# Patient Record
Sex: Female | Born: 1955 | Race: White | Hispanic: No | Marital: Married
Health system: Southern US, Community
[De-identification: ages and names within clinical notes are randomized; demographics above are authoritative.]

---

## 2002-07-28 ENCOUNTER — Encounter: Payer: Self-pay | Admitting: Internal Medicine

## 2002-07-28 ENCOUNTER — Encounter: Admission: RE | Admit: 2002-07-28 | Discharge: 2002-07-28 | Payer: Self-pay | Admitting: Internal Medicine

## 2002-08-07 ENCOUNTER — Encounter: Admission: RE | Admit: 2002-08-07 | Discharge: 2002-08-07 | Payer: Self-pay | Admitting: Internal Medicine

## 2002-08-07 ENCOUNTER — Encounter: Payer: Self-pay | Admitting: Internal Medicine

## 2002-08-21 ENCOUNTER — Encounter: Admission: RE | Admit: 2002-08-21 | Discharge: 2002-08-21 | Payer: Self-pay | Admitting: Internal Medicine

## 2002-09-18 ENCOUNTER — Encounter: Admission: RE | Admit: 2002-09-18 | Discharge: 2002-09-18 | Payer: Self-pay | Admitting: Internal Medicine

## 2002-09-18 ENCOUNTER — Encounter: Payer: Self-pay | Admitting: Internal Medicine

## 2002-09-30 ENCOUNTER — Encounter: Admission: RE | Admit: 2002-09-30 | Discharge: 2002-09-30 | Payer: Self-pay | Admitting: Internal Medicine

## 2002-09-30 ENCOUNTER — Encounter: Payer: Self-pay | Admitting: Internal Medicine

## 2002-10-20 ENCOUNTER — Encounter: Admission: RE | Admit: 2002-10-20 | Discharge: 2002-10-20 | Payer: Self-pay | Admitting: Internal Medicine

## 2002-10-20 ENCOUNTER — Encounter: Payer: Self-pay | Admitting: Internal Medicine

## 2002-11-03 ENCOUNTER — Encounter: Admission: RE | Admit: 2002-11-03 | Discharge: 2002-11-03 | Payer: Self-pay | Admitting: Internal Medicine

## 2002-11-03 ENCOUNTER — Encounter: Payer: Self-pay | Admitting: Internal Medicine

## 2002-11-18 ENCOUNTER — Encounter: Admission: RE | Admit: 2002-11-18 | Discharge: 2002-11-18 | Payer: Self-pay | Admitting: Internal Medicine

## 2002-11-18 ENCOUNTER — Encounter: Payer: Self-pay | Admitting: Internal Medicine

## 2002-12-21 ENCOUNTER — Encounter: Admission: RE | Admit: 2002-12-21 | Discharge: 2002-12-21 | Payer: Self-pay | Admitting: Internal Medicine

## 2002-12-21 ENCOUNTER — Encounter: Payer: Self-pay | Admitting: Internal Medicine

## 2002-12-25 ENCOUNTER — Encounter (INDEPENDENT_AMBULATORY_CARE_PROVIDER_SITE_OTHER): Payer: Self-pay | Admitting: *Deleted

## 2002-12-25 ENCOUNTER — Ambulatory Visit (HOSPITAL_BASED_OUTPATIENT_CLINIC_OR_DEPARTMENT_OTHER): Admission: RE | Admit: 2002-12-25 | Discharge: 2002-12-25 | Payer: Self-pay | Admitting: General Surgery

## 2004-01-13 ENCOUNTER — Encounter: Admission: RE | Admit: 2004-01-13 | Discharge: 2004-01-13 | Payer: Self-pay | Admitting: Infectious Diseases

## 2007-09-30 ENCOUNTER — Encounter: Admission: RE | Admit: 2007-09-30 | Discharge: 2007-09-30 | Payer: Self-pay | Admitting: Family Medicine

## 2007-10-07 ENCOUNTER — Encounter: Admission: RE | Admit: 2007-10-07 | Discharge: 2007-10-07 | Payer: Self-pay | Admitting: Family Medicine

## 2007-10-17 ENCOUNTER — Encounter: Admission: RE | Admit: 2007-10-17 | Discharge: 2007-10-17 | Payer: Self-pay | Admitting: Family Medicine

## 2007-10-17 ENCOUNTER — Encounter (INDEPENDENT_AMBULATORY_CARE_PROVIDER_SITE_OTHER): Payer: Self-pay | Admitting: Diagnostic Radiology

## 2008-04-06 ENCOUNTER — Encounter: Admission: RE | Admit: 2008-04-06 | Discharge: 2008-04-06 | Payer: Self-pay | Admitting: Family Medicine

## 2008-06-02 ENCOUNTER — Emergency Department (HOSPITAL_COMMUNITY): Admission: EM | Admit: 2008-06-02 | Discharge: 2008-06-02 | Payer: Self-pay | Admitting: Emergency Medicine

## 2010-11-18 ENCOUNTER — Encounter: Payer: Self-pay | Admitting: General Surgery

## 2011-03-16 NOTE — Op Note (Signed)
NAME:  TENLEIGH, BYER                         ACCOUNT NO.:  [id number]   MEDICAL RECORD NO.:  [id number]                   PATIENT TYPE:  AMB   LOCATION:  DSC                                  FACILITY:  [facility]   PHYSICIAN:  Anselm Pancoast. Zachery Dakins, M.D.          DATE OF BIRTH:  May 05, 1956   DATE OF PROCEDURE:  12/25/2002  DATE OF DISCHARGE:                                 OPERATIVE REPORT   PREOPERATIVE DIAGNOSIS:  Recurrent subareolar abscess left.   POSTOPERATIVE DIAGNOSIS:  Recurrent subareolar abscess left.   OPERATION PERFORMED:   SURGEON:  Anselm Pancoast. Zachery Dakins, M.D.   ASSISTANT:   ANESTHESIA:   COMPLICATIONS:   INDICATIONS FOR PROCEDURE:  The patient is a 55 year old female who was  referred to [place] actually by Dr. ________ [facility] where the  patient has been followed over the last several months, originally seen by  Sharlet Salina, M.D. for a left breast abscess.  This drained  spontaneously.  She had been on antibiotics continuing to be getting better  and then approximately a week ago she started having similar recurring  symptoms.  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 This was thought to be benign but I saw her in the office two days ago  and she said that really she wanted to wait until the weekend if possible  because of work on having any surgery done and she is scheduled for this  time approximately 48 hours later.  She has been on Keflex over the last 48  hours.  The area has drained and a little second area very close and the  actual erythema is actually less than when I saw her.  She had a hemoglobin  this morning and is in no chronic health problems.   DESCRIPTION OF PROCEDURE:  The patient was taken to the operative suite.  She had been given a gram of Kefzol.  She was positioned on the operating  table with induction of general  anesthesia.  The left breast, first I used a  hemostat to kind of open the area and good tablespoon of frank purulence  came forth.  This was cultured anaerobically and then the breast was prepped  with Betadine surgical scrub and solution and draped in a sterile manner.  The little areas where it has drained, its course not right at the true  areolar edge but close and I made a little curved incision including the two  little areas of spontaneous drain at the lateral edge of the areola.  This  was approximately the 10 to 12 o'clock position, and then dissected first  superiorly and laterally to kind of create a little pocket so that this  whole chronic infected area can be excised.  Then I switched to the opposite  side  which would give me direct exposure towards the areolar complex and  then dissected down freeing up the breast from the overlying skin and then  took right down to the central nipple area and removed the central little  core of duct structures.  The skin, I think, is definitely still viable.  There was a little firm area right in the subareolar area that was excised  and then this whole basically posterior wall of the abscess was excised down  to normal breast tissue.  I thoroughly irrigated.  There were a couple of  areas that required suturing with 3-0 chromic.  Most of the little bleeders  could actually be lightly coagulated.  After I thoroughly washed the area  out, good hemostasis, placed two quarter inch Penrose drains, superiorly and  inferiorly and brought the medial portion of the skin together with three  simple sutures of 4-0 nylon.  We will leave the drains in place for at least  48 hours and I am instructing her to actually get in the shower, start  soaking this twice a day on Sunday and we will continue on the Keflex p.o.  until the results of the culture shows if we need to make a change.  Clinically, the erythema is markedly less today than it was when I saw  her  48 hours earlier.  The pathology specimen will be examined permanently.  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                                               Anselm Pancoast. Zachery Dakins, M.D.    WJW/MEDQ  D:  12/25/2002  T:  12/25/2002  Job:  [id number]

## 2011-07-27 LAB — POCT I-STAT, CHEM 8
BUN: 15
Calcium, Ion: 1.15
Chloride: 100
Creatinine, Ser: 1
Glucose, Bld: 94
HCT: 56 — ABNORMAL HIGH
Potassium: 3.8

## 2012-07-29 ENCOUNTER — Other Ambulatory Visit (HOSPITAL_COMMUNITY)
Admission: RE | Admit: 2012-07-29 | Discharge: 2012-07-29 | Disposition: A | Discharge status: Home / Self Care | Payer: BC Managed Care – PPO | Source: Ambulatory Visit | Attending: Family Medicine | Admitting: Family Medicine

## 2012-07-29 ENCOUNTER — Other Ambulatory Visit: Payer: Self-pay | Admitting: Family Medicine

## 2012-07-29 DIAGNOSIS — Z1151 Encounter for screening for human papillomavirus (HPV): Secondary | ICD-10-CM | POA: Insufficient documentation

## 2012-07-29 DIAGNOSIS — Z124 Encounter for screening for malignant neoplasm of cervix: Secondary | ICD-10-CM | POA: Insufficient documentation

## 2016-07-19 ENCOUNTER — Other Ambulatory Visit: Payer: Self-pay | Admitting: Family Medicine

## 2016-07-19 DIAGNOSIS — Z1231 Encounter for screening mammogram for malignant neoplasm of breast: Secondary | ICD-10-CM

## 2016-07-30 ENCOUNTER — Encounter: Payer: Self-pay | Admitting: Radiology

## 2016-07-30 ENCOUNTER — Ambulatory Visit
Admission: RE | Admit: 2016-07-30 | Discharge: 2016-07-30 | Disposition: A | Discharge status: Home / Self Care | Payer: BC Managed Care – PPO | Source: Ambulatory Visit | Attending: Family Medicine | Admitting: Family Medicine

## 2016-07-30 DIAGNOSIS — Z1231 Encounter for screening mammogram for malignant neoplasm of breast: Secondary | ICD-10-CM

## 2016-08-14 ENCOUNTER — Encounter: Payer: BC Managed Care – PPO | Attending: Family Medicine | Admitting: *Deleted

## 2016-08-14 DIAGNOSIS — Z6828 Body mass index (BMI) 28.0-28.9, adult: Secondary | ICD-10-CM | POA: Diagnosis not present

## 2016-08-14 DIAGNOSIS — Z713 Dietary counseling and surveillance: Secondary | ICD-10-CM | POA: Insufficient documentation

## 2016-08-14 DIAGNOSIS — E119 Type 2 diabetes mellitus without complications: Secondary | ICD-10-CM | POA: Diagnosis not present

## 2016-08-14 NOTE — Progress Notes (Signed)

## 2016-08-21 ENCOUNTER — Encounter: Payer: BC Managed Care – PPO | Admitting: *Deleted

## 2016-08-21 ENCOUNTER — Ambulatory Visit: Payer: BC Managed Care – PPO

## 2016-08-21 DIAGNOSIS — E119 Type 2 diabetes mellitus without complications: Secondary | ICD-10-CM

## 2016-08-21 NOTE — Progress Notes (Signed)

## 2016-08-28 DIAGNOSIS — E119 Type 2 diabetes mellitus without complications: Secondary | ICD-10-CM | POA: Diagnosis not present

## 2016-08-28 NOTE — Progress Notes (Signed)
Patient was seen on 08/28/2016 for the third of a series of three diabetes self-management courses at the [facility].   Kristin Weaver. State the amount of activity recommended for healthy living . Describe activities suitable for individual needs . Identify ways to regularly incorporate activity into daily life . Identify barriers to activity and ways to over come these barriers  Identify diabetes medications being personally used and their primary action for lowering glucose and possible side effects . Describe role of stress on blood glucose and develop strategies to address psychosocial issues . Identify diabetes complications and ways to prevent them  Explain how to manage diabetes during illness . Evaluate success in meeting personal goal . Establish 2-3 goals that they will plan to diligently work on until they return for the  8712-month follow-up visit  Goals:   I will count my carb choices at most meals and snacks  I will be active 30 minutes or more 4 times a week  To help manage stress I will  exercise at least 4 times a week  Your patient has identified these potential barriers to change:   None stated by patient  Your patient has identified their diabetes self-care support plan as  Family Support, friends and  Co-workers Plan:  Attend Support Group as desired

## 2016-09-11 ENCOUNTER — Encounter: Payer: Self-pay | Admitting: Family Medicine

## 2017-07-16 ENCOUNTER — Other Ambulatory Visit: Payer: Self-pay | Admitting: Family Medicine

## 2017-07-16 DIAGNOSIS — Z1231 Encounter for screening mammogram for malignant neoplasm of breast: Secondary | ICD-10-CM

## 2017-08-09 ENCOUNTER — Ambulatory Visit
Admission: RE | Admit: 2017-08-09 | Discharge: 2017-08-09 | Disposition: A | Discharge status: Home / Self Care | Payer: BC Managed Care – PPO | Source: Ambulatory Visit | Attending: Family Medicine | Admitting: Family Medicine

## 2017-08-09 DIAGNOSIS — Z1231 Encounter for screening mammogram for malignant neoplasm of breast: Secondary | ICD-10-CM

## 2017-08-12 ENCOUNTER — Other Ambulatory Visit: Payer: Self-pay | Admitting: Family Medicine

## 2017-08-12 ENCOUNTER — Other Ambulatory Visit (HOSPITAL_COMMUNITY)
Admission: RE | Admit: 2017-08-12 | Discharge: 2017-08-12 | Disposition: A | Discharge status: Home / Self Care | Payer: BC Managed Care – PPO | Source: Ambulatory Visit | Attending: Family Medicine | Admitting: Family Medicine

## 2017-08-12 DIAGNOSIS — Z124 Encounter for screening for malignant neoplasm of cervix: Secondary | ICD-10-CM | POA: Insufficient documentation

## 2017-08-15 LAB — CYTOLOGY - PAP: HPV (WINDOPATH): NOT DETECTED

## 2021-01-27 DIAGNOSIS — Z87891 Personal history of nicotine dependence: Secondary | ICD-10-CM | POA: Diagnosis not present

## 2021-01-27 DIAGNOSIS — M8580 Other specified disorders of bone density and structure, unspecified site: Secondary | ICD-10-CM | POA: Diagnosis not present

## 2021-01-27 DIAGNOSIS — Z1211 Encounter for screening for malignant neoplasm of colon: Secondary | ICD-10-CM | POA: Diagnosis not present

## 2021-01-27 DIAGNOSIS — I10 Essential (primary) hypertension: Secondary | ICD-10-CM | POA: Diagnosis not present

## 2021-01-27 DIAGNOSIS — E7800 Pure hypercholesterolemia, unspecified: Secondary | ICD-10-CM | POA: Diagnosis not present

## 2021-01-27 DIAGNOSIS — M654 Radial styloid tenosynovitis [de Quervain]: Secondary | ICD-10-CM | POA: Diagnosis not present

## 2021-01-27 DIAGNOSIS — Z6830 Body mass index (BMI) 30.0-30.9, adult: Secondary | ICD-10-CM | POA: Diagnosis not present

## 2021-01-27 DIAGNOSIS — E1169 Type 2 diabetes mellitus with other specified complication: Secondary | ICD-10-CM | POA: Diagnosis not present

## 2021-06-28 DIAGNOSIS — Z780 Asymptomatic menopausal state: Secondary | ICD-10-CM | POA: Diagnosis not present

## 2021-06-28 DIAGNOSIS — Z7984 Long term (current) use of oral hypoglycemic drugs: Secondary | ICD-10-CM | POA: Diagnosis not present

## 2021-06-28 DIAGNOSIS — I10 Essential (primary) hypertension: Secondary | ICD-10-CM | POA: Diagnosis not present

## 2021-06-28 DIAGNOSIS — E7800 Pure hypercholesterolemia, unspecified: Secondary | ICD-10-CM | POA: Diagnosis not present

## 2021-06-28 DIAGNOSIS — Z79899 Other long term (current) drug therapy: Secondary | ICD-10-CM | POA: Diagnosis not present

## 2021-06-28 DIAGNOSIS — Z0000 Encounter for general adult medical examination without abnormal findings: Secondary | ICD-10-CM | POA: Diagnosis not present

## 2021-06-28 DIAGNOSIS — E1169 Type 2 diabetes mellitus with other specified complication: Secondary | ICD-10-CM | POA: Diagnosis not present

## 2021-06-28 DIAGNOSIS — E559 Vitamin D deficiency, unspecified: Secondary | ICD-10-CM | POA: Diagnosis not present

## 2021-06-29 ENCOUNTER — Other Ambulatory Visit: Payer: Self-pay | Admitting: Family Medicine

## 2021-06-29 DIAGNOSIS — E2839 Other primary ovarian failure: Secondary | ICD-10-CM

## 2021-06-29 DIAGNOSIS — Z1231 Encounter for screening mammogram for malignant neoplasm of breast: Secondary | ICD-10-CM

## 2021-07-05 DIAGNOSIS — Z0000 Encounter for general adult medical examination without abnormal findings: Secondary | ICD-10-CM | POA: Diagnosis not present

## 2021-07-05 DIAGNOSIS — I10 Essential (primary) hypertension: Secondary | ICD-10-CM | POA: Diagnosis not present

## 2021-07-05 DIAGNOSIS — E559 Vitamin D deficiency, unspecified: Secondary | ICD-10-CM | POA: Diagnosis not present

## 2021-07-05 DIAGNOSIS — E1169 Type 2 diabetes mellitus with other specified complication: Secondary | ICD-10-CM | POA: Diagnosis not present

## 2021-07-05 DIAGNOSIS — Z780 Asymptomatic menopausal state: Secondary | ICD-10-CM | POA: Diagnosis not present

## 2021-07-05 DIAGNOSIS — Z79899 Other long term (current) drug therapy: Secondary | ICD-10-CM | POA: Diagnosis not present

## 2021-07-05 DIAGNOSIS — E7800 Pure hypercholesterolemia, unspecified: Secondary | ICD-10-CM | POA: Diagnosis not present

## 2021-08-02 DIAGNOSIS — E119 Type 2 diabetes mellitus without complications: Secondary | ICD-10-CM | POA: Diagnosis not present

## 2021-08-16 ENCOUNTER — Other Ambulatory Visit: Payer: Self-pay

## 2021-08-16 ENCOUNTER — Ambulatory Visit
Admission: RE | Admit: 2021-08-16 | Discharge: 2021-08-16 | Disposition: A | Discharge status: Home / Self Care | Payer: Medicare PPO | Source: Ambulatory Visit | Attending: Family Medicine | Admitting: Family Medicine

## 2021-08-16 DIAGNOSIS — Z1231 Encounter for screening mammogram for malignant neoplasm of breast: Secondary | ICD-10-CM | POA: Diagnosis not present

## 2021-10-30 DIAGNOSIS — H109 Unspecified conjunctivitis: Secondary | ICD-10-CM | POA: Diagnosis not present

## 2021-10-30 DIAGNOSIS — B9689 Other specified bacterial agents as the cause of diseases classified elsewhere: Secondary | ICD-10-CM | POA: Diagnosis not present

## 2021-12-20 ENCOUNTER — Other Ambulatory Visit: Payer: BC Managed Care – PPO

## 2022-01-23 DIAGNOSIS — E7800 Pure hypercholesterolemia, unspecified: Secondary | ICD-10-CM | POA: Diagnosis not present

## 2022-01-23 DIAGNOSIS — Z1211 Encounter for screening for malignant neoplasm of colon: Secondary | ICD-10-CM | POA: Diagnosis not present

## 2022-01-23 DIAGNOSIS — I10 Essential (primary) hypertension: Secondary | ICD-10-CM | POA: Diagnosis not present

## 2022-01-23 DIAGNOSIS — E1169 Type 2 diabetes mellitus with other specified complication: Secondary | ICD-10-CM | POA: Diagnosis not present

## 2022-01-23 DIAGNOSIS — E559 Vitamin D deficiency, unspecified: Secondary | ICD-10-CM | POA: Diagnosis not present

## 2022-01-23 DIAGNOSIS — E669 Obesity, unspecified: Secondary | ICD-10-CM | POA: Diagnosis not present

## 2022-02-16 DIAGNOSIS — R059 Cough, unspecified: Secondary | ICD-10-CM | POA: Diagnosis not present

## 2022-02-16 DIAGNOSIS — J069 Acute upper respiratory infection, unspecified: Secondary | ICD-10-CM | POA: Diagnosis not present

## 2022-03-01 ENCOUNTER — Ambulatory Visit
Admission: RE | Admit: 2022-03-01 | Discharge: 2022-03-01 | Disposition: A | Discharge status: Home / Self Care | Payer: Medicare PPO | Source: Ambulatory Visit | Attending: Family Medicine | Admitting: Family Medicine

## 2022-03-01 DIAGNOSIS — E2839 Other primary ovarian failure: Secondary | ICD-10-CM

## 2022-03-01 DIAGNOSIS — Z780 Asymptomatic menopausal state: Secondary | ICD-10-CM | POA: Diagnosis not present

## 2022-03-01 DIAGNOSIS — M85851 Other specified disorders of bone density and structure, right thigh: Secondary | ICD-10-CM | POA: Diagnosis not present

## 2022-07-04 DIAGNOSIS — Z0000 Encounter for general adult medical examination without abnormal findings: Secondary | ICD-10-CM | POA: Diagnosis not present

## 2022-09-10 DIAGNOSIS — E1169 Type 2 diabetes mellitus with other specified complication: Secondary | ICD-10-CM | POA: Diagnosis not present

## 2022-09-10 DIAGNOSIS — Z79899 Other long term (current) drug therapy: Secondary | ICD-10-CM | POA: Diagnosis not present

## 2022-09-10 DIAGNOSIS — E7800 Pure hypercholesterolemia, unspecified: Secondary | ICD-10-CM | POA: Diagnosis not present

## 2022-09-10 DIAGNOSIS — E559 Vitamin D deficiency, unspecified: Secondary | ICD-10-CM | POA: Diagnosis not present

## 2022-09-12 DIAGNOSIS — Z1211 Encounter for screening for malignant neoplasm of colon: Secondary | ICD-10-CM | POA: Diagnosis not present

## 2022-09-12 DIAGNOSIS — I10 Essential (primary) hypertension: Secondary | ICD-10-CM | POA: Diagnosis not present

## 2022-09-12 DIAGNOSIS — E1169 Type 2 diabetes mellitus with other specified complication: Secondary | ICD-10-CM | POA: Diagnosis not present

## 2022-09-12 DIAGNOSIS — E7800 Pure hypercholesterolemia, unspecified: Secondary | ICD-10-CM | POA: Diagnosis not present

## 2022-09-12 DIAGNOSIS — Z6832 Body mass index (BMI) 32.0-32.9, adult: Secondary | ICD-10-CM | POA: Diagnosis not present

## 2022-09-12 DIAGNOSIS — E559 Vitamin D deficiency, unspecified: Secondary | ICD-10-CM | POA: Diagnosis not present

## 2022-11-07 DIAGNOSIS — Z6832 Body mass index (BMI) 32.0-32.9, adult: Secondary | ICD-10-CM | POA: Diagnosis not present

## 2022-11-07 DIAGNOSIS — Z03818 Encounter for observation for suspected exposure to other biological agents ruled out: Secondary | ICD-10-CM | POA: Diagnosis not present

## 2022-11-07 DIAGNOSIS — H6121 Impacted cerumen, right ear: Secondary | ICD-10-CM | POA: Diagnosis not present

## 2022-11-07 DIAGNOSIS — J069 Acute upper respiratory infection, unspecified: Secondary | ICD-10-CM | POA: Diagnosis not present

## 2022-11-07 DIAGNOSIS — R051 Acute cough: Secondary | ICD-10-CM | POA: Diagnosis not present

## 2023-03-20 DIAGNOSIS — E669 Obesity, unspecified: Secondary | ICD-10-CM | POA: Diagnosis not present

## 2023-03-20 DIAGNOSIS — I10 Essential (primary) hypertension: Secondary | ICD-10-CM | POA: Diagnosis not present

## 2023-03-20 DIAGNOSIS — E119 Type 2 diabetes mellitus without complications: Secondary | ICD-10-CM | POA: Diagnosis not present

## 2023-03-20 DIAGNOSIS — Z79899 Other long term (current) drug therapy: Secondary | ICD-10-CM | POA: Diagnosis not present

## 2023-03-20 DIAGNOSIS — E7800 Pure hypercholesterolemia, unspecified: Secondary | ICD-10-CM | POA: Diagnosis not present

## 2023-03-20 DIAGNOSIS — Z6832 Body mass index (BMI) 32.0-32.9, adult: Secondary | ICD-10-CM | POA: Diagnosis not present

## 2023-03-20 DIAGNOSIS — E1169 Type 2 diabetes mellitus with other specified complication: Secondary | ICD-10-CM | POA: Diagnosis not present

## 2023-07-08 DIAGNOSIS — Z0000 Encounter for general adult medical examination without abnormal findings: Secondary | ICD-10-CM | POA: Diagnosis not present

## 2023-07-08 DIAGNOSIS — Z9181 History of falling: Secondary | ICD-10-CM | POA: Diagnosis not present

## 2023-07-08 DIAGNOSIS — Z1211 Encounter for screening for malignant neoplasm of colon: Secondary | ICD-10-CM | POA: Diagnosis not present

## 2023-07-08 DIAGNOSIS — Z1389 Encounter for screening for other disorder: Secondary | ICD-10-CM | POA: Diagnosis not present

## 2023-07-08 DIAGNOSIS — E669 Obesity, unspecified: Secondary | ICD-10-CM | POA: Diagnosis not present

## 2023-07-10 ENCOUNTER — Other Ambulatory Visit: Payer: Self-pay | Admitting: Family Medicine

## 2023-07-10 DIAGNOSIS — Z1231 Encounter for screening mammogram for malignant neoplasm of breast: Secondary | ICD-10-CM

## 2023-08-19 ENCOUNTER — Ambulatory Visit
Admission: RE | Admit: 2023-08-19 | Discharge: 2023-08-19 | Disposition: A | Discharge status: Home / Self Care | Payer: Medicare PPO | Source: Ambulatory Visit | Attending: Family Medicine | Admitting: Family Medicine

## 2023-08-19 DIAGNOSIS — Z1231 Encounter for screening mammogram for malignant neoplasm of breast: Secondary | ICD-10-CM

## 2023-09-03 IMAGING — MG MM DIGITAL SCREENING BILAT W/ TOMO AND CAD
8 series · 8 of 24 positions shown · non-contrast
Comparison: Previous exam(s).

CLINICAL DATA: Screening.

EXAM:
DIGITAL SCREENING BILATERAL MAMMOGRAM WITH TOMOSYNTHESIS AND CAD
TECHNIQUE: Bilateral screening digital craniocaudal and mediolateral oblique
mammograms were obtained. Bilateral screening digital breast
tomosynthesis was performed. The images were evaluated with
computer-aided detection.

[R MLO synth-2D]
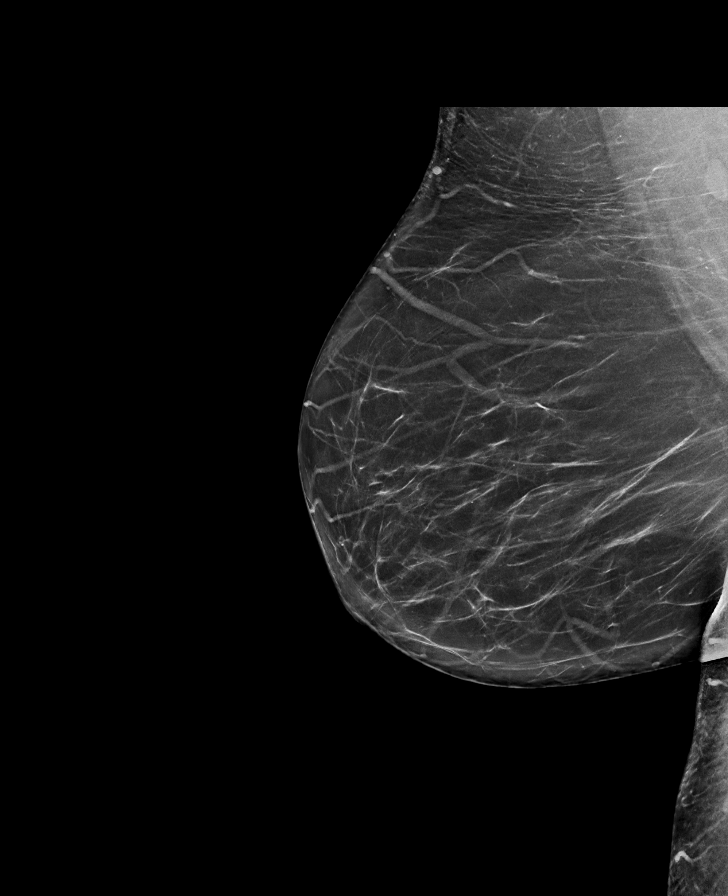

[L CC synth-2D]
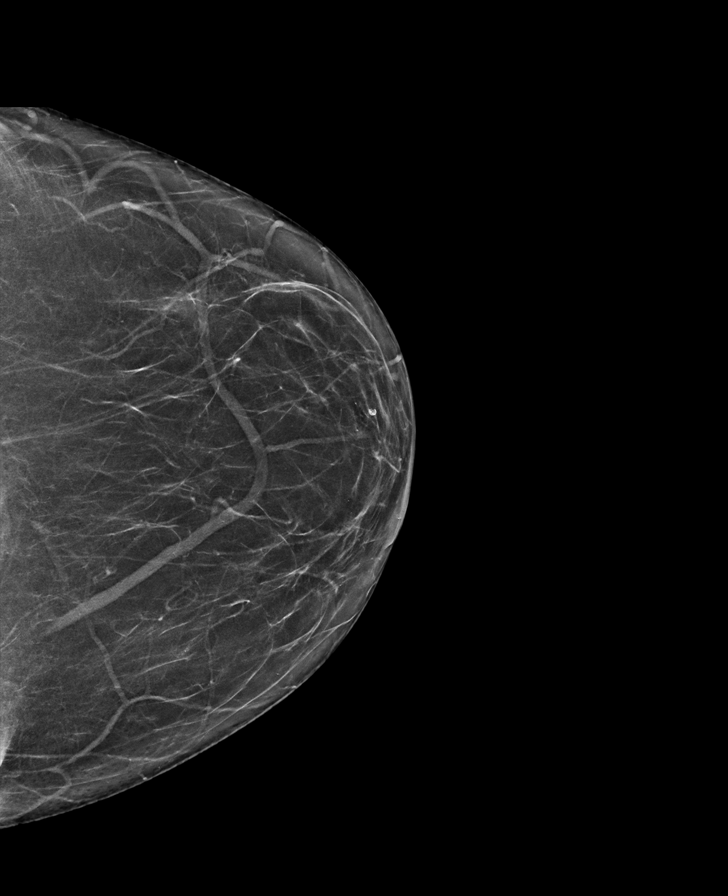

[L MLO synth-2D]
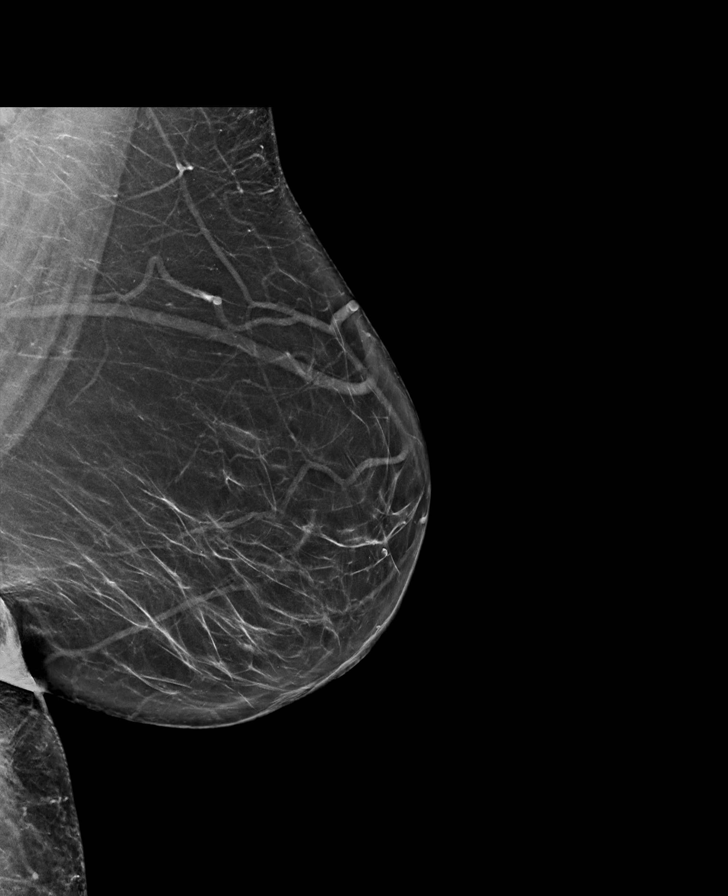

[R CC synth-2D]
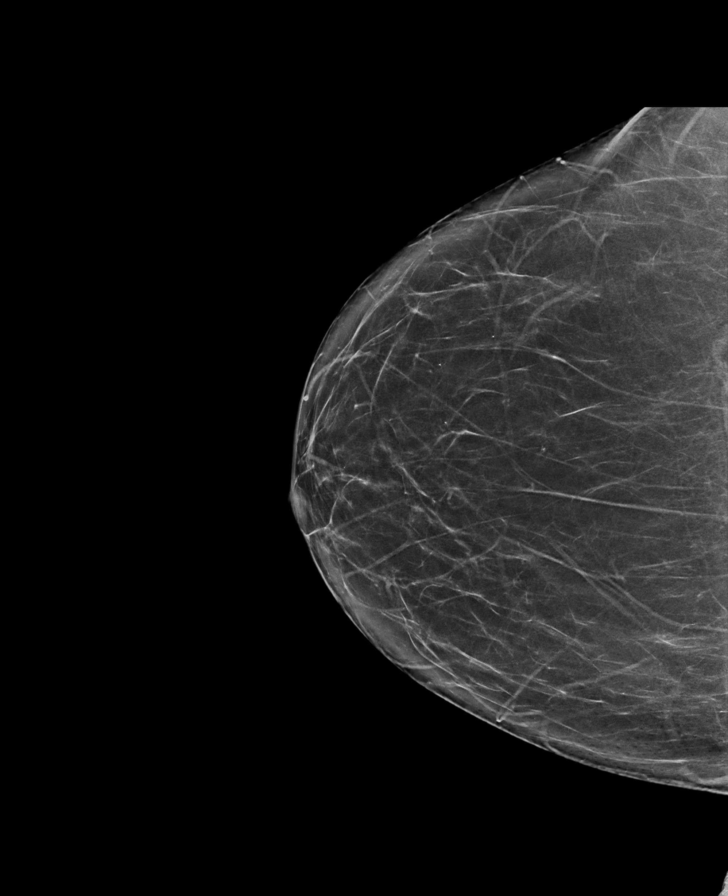

[L MLO tomo · tomo slice 39/77.0]
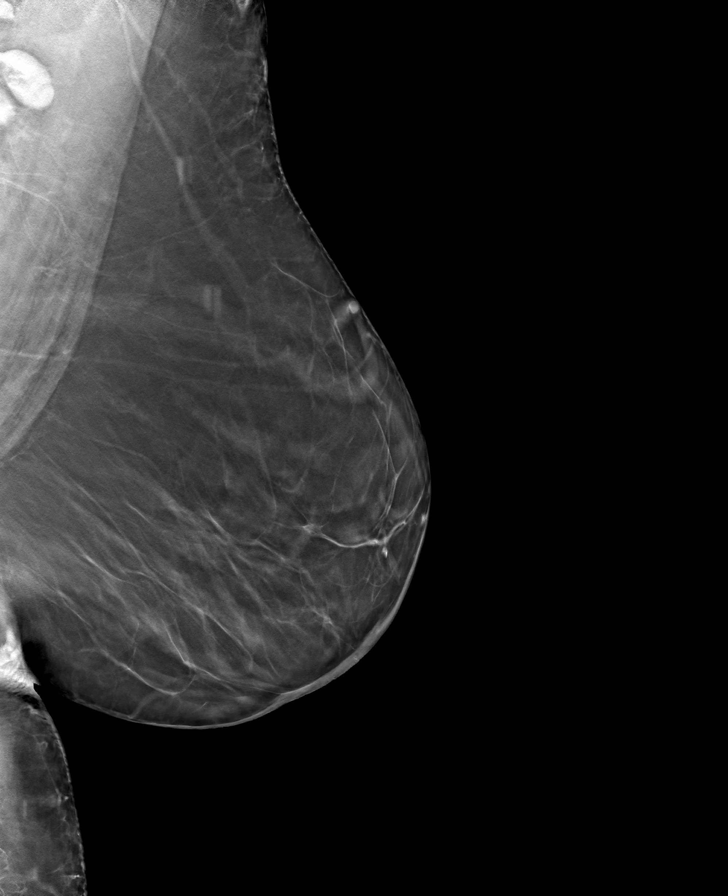

[L CC tomo · tomo slice 32/63.0]
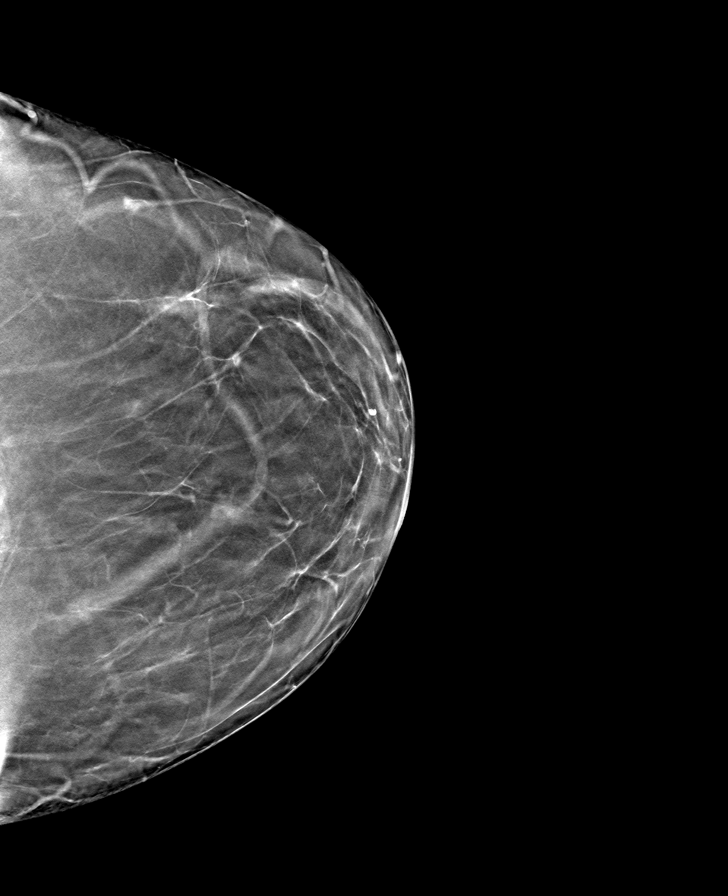

[R MLO tomo · tomo slice 41/82.0]
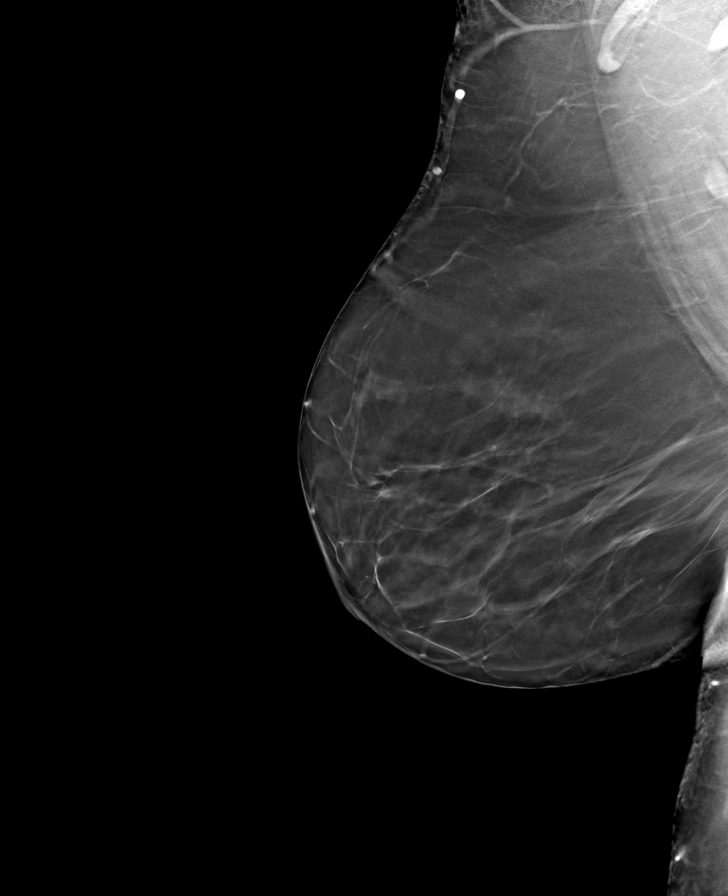

[R CC tomo · tomo slice 35/68.0]
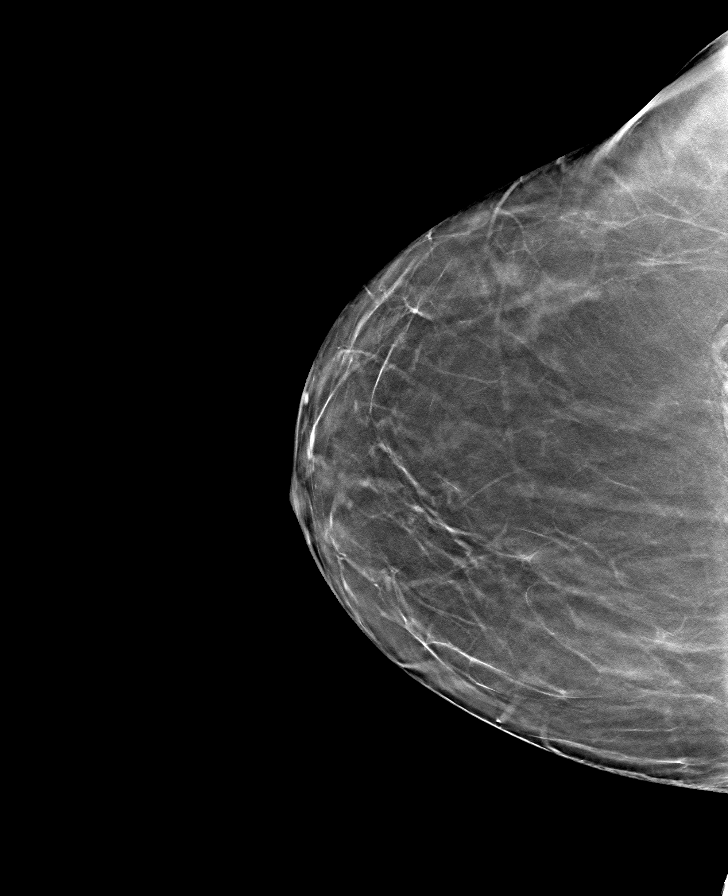

[8 of 24 positions shown; findings below may reference images not displayed]

ACR Breast Density Category b: There are scattered areas of
fibroglandular density.
FINDINGS: There are no findings suspicious for malignancy.
IMPRESSION: No mammographic evidence of malignancy. A result letter of this
screening mammogram will be mailed directly to the patient.

RECOMMENDATION:
Screening mammogram in one year. (Code:51-O-LD2)

BI-RADS CATEGORY  1: Negative.

## 2023-09-08 DIAGNOSIS — E119 Type 2 diabetes mellitus without complications: Secondary | ICD-10-CM | POA: Diagnosis not present

## 2023-09-13 DIAGNOSIS — E1169 Type 2 diabetes mellitus with other specified complication: Secondary | ICD-10-CM | POA: Diagnosis not present

## 2023-09-13 DIAGNOSIS — Z1211 Encounter for screening for malignant neoplasm of colon: Secondary | ICD-10-CM | POA: Diagnosis not present

## 2023-09-18 DIAGNOSIS — E119 Type 2 diabetes mellitus without complications: Secondary | ICD-10-CM | POA: Diagnosis not present

## 2023-09-18 DIAGNOSIS — E7800 Pure hypercholesterolemia, unspecified: Secondary | ICD-10-CM | POA: Diagnosis not present

## 2023-09-18 DIAGNOSIS — Z6833 Body mass index (BMI) 33.0-33.9, adult: Secondary | ICD-10-CM | POA: Diagnosis not present

## 2023-09-18 DIAGNOSIS — I10 Essential (primary) hypertension: Secondary | ICD-10-CM | POA: Diagnosis not present

## 2023-09-18 DIAGNOSIS — M7061 Trochanteric bursitis, right hip: Secondary | ICD-10-CM | POA: Diagnosis not present

## 2023-09-18 DIAGNOSIS — E669 Obesity, unspecified: Secondary | ICD-10-CM | POA: Diagnosis not present

## 2024-01-15 DIAGNOSIS — E119 Type 2 diabetes mellitus without complications: Secondary | ICD-10-CM | POA: Diagnosis not present

## 2024-03-16 DIAGNOSIS — Z6832 Body mass index (BMI) 32.0-32.9, adult: Secondary | ICD-10-CM | POA: Diagnosis not present

## 2024-03-16 DIAGNOSIS — E669 Obesity, unspecified: Secondary | ICD-10-CM | POA: Diagnosis not present

## 2024-03-16 DIAGNOSIS — E119 Type 2 diabetes mellitus without complications: Secondary | ICD-10-CM | POA: Diagnosis not present

## 2024-04-06 DIAGNOSIS — E1169 Type 2 diabetes mellitus with other specified complication: Secondary | ICD-10-CM | POA: Diagnosis not present

## 2024-07-06 DIAGNOSIS — E119 Type 2 diabetes mellitus without complications: Secondary | ICD-10-CM | POA: Diagnosis not present

## 2024-07-06 DIAGNOSIS — E559 Vitamin D deficiency, unspecified: Secondary | ICD-10-CM | POA: Diagnosis not present

## 2024-07-06 DIAGNOSIS — Z0000 Encounter for general adult medical examination without abnormal findings: Secondary | ICD-10-CM | POA: Diagnosis not present

## 2024-07-06 DIAGNOSIS — Z1211 Encounter for screening for malignant neoplasm of colon: Secondary | ICD-10-CM | POA: Diagnosis not present

## 2024-07-06 DIAGNOSIS — Z1331 Encounter for screening for depression: Secondary | ICD-10-CM | POA: Diagnosis not present

## 2024-08-05 ENCOUNTER — Other Ambulatory Visit: Payer: Self-pay | Admitting: Family Medicine

## 2024-08-05 DIAGNOSIS — Z1231 Encounter for screening mammogram for malignant neoplasm of breast: Secondary | ICD-10-CM

## 2024-08-20 DIAGNOSIS — R051 Acute cough: Secondary | ICD-10-CM | POA: Diagnosis not present

## 2024-08-20 DIAGNOSIS — J069 Acute upper respiratory infection, unspecified: Secondary | ICD-10-CM | POA: Diagnosis not present

## 2024-09-03 DIAGNOSIS — E7800 Pure hypercholesterolemia, unspecified: Secondary | ICD-10-CM | POA: Diagnosis not present

## 2024-09-03 DIAGNOSIS — E559 Vitamin D deficiency, unspecified: Secondary | ICD-10-CM | POA: Diagnosis not present

## 2024-09-03 DIAGNOSIS — Z1211 Encounter for screening for malignant neoplasm of colon: Secondary | ICD-10-CM | POA: Diagnosis not present

## 2024-09-03 DIAGNOSIS — E119 Type 2 diabetes mellitus without complications: Secondary | ICD-10-CM | POA: Diagnosis not present

## 2024-09-03 DIAGNOSIS — I10 Essential (primary) hypertension: Secondary | ICD-10-CM | POA: Diagnosis not present

## 2024-09-04 ENCOUNTER — Ambulatory Visit
Admission: RE | Admit: 2024-09-04 | Discharge: 2024-09-04 | Disposition: A | Discharge status: Home / Self Care | Source: Ambulatory Visit | Attending: Family Medicine | Admitting: Family Medicine

## 2024-09-04 DIAGNOSIS — Z1231 Encounter for screening mammogram for malignant neoplasm of breast: Secondary | ICD-10-CM
